# Patient Record
Sex: Female | Born: 2000 | Hispanic: No | Marital: Single | Smoking: Never smoker
Health system: Southern US, Community
[De-identification: ages and names within clinical notes are randomized; demographics above are authoritative.]

---
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
## 2023-12-08 ENCOUNTER — Encounter (HOSPITAL_COMMUNITY): Payer: Self-pay | Admitting: Family Medicine

## 2023-12-08 ENCOUNTER — Ambulatory Visit (HOSPITAL_COMMUNITY)
Admission: EM | Admit: 2023-12-08 | Discharge: 2023-12-08 | Disposition: A | Discharge status: Home / Self Care | Attending: Family Medicine | Admitting: Family Medicine

## 2023-12-08 DIAGNOSIS — J09X2 Influenza due to identified novel influenza A virus with other respiratory manifestations: Secondary | ICD-10-CM

## 2023-12-08 LAB — POC COVID19/FLU A&B COMBO
Covid Antigen, POC: NEGATIVE
Influenza A Antigen, POC: POSITIVE — AB
Influenza B Antigen, POC: NEGATIVE

## 2023-12-08 LAB — POCT RAPID STREP A (OFFICE): Rapid Strep A Screen: NEGATIVE

## 2023-12-08 MED ORDER — OSELTAMIVIR PHOSPHATE 75 MG PO CAPS: 75.0000 mg | ORAL_CAPSULE | Freq: Two times a day (BID) | ORAL | 0 refills | Status: AC

## 2023-12-08 NOTE — ED Triage Notes (Signed)
 Patient here today with c/o nasal congestion, ST, and body aches X 2 days. She has taken Dayquil with some relief. No known sick contacts.

## 2023-12-08 NOTE — ED Provider Notes (Signed)
 [facility]    CSN: [id number] Arrival date & time: 12/08/23  0803      History   Chief Complaint Chief Complaint  Patient presents with   Nasal Congestion    HPI Pamela Moran is a 23 y.o. female.   The patient reports 2 days of congestion postnasal drainage, and sore throat that started prior to a nonproductive cough.  Today she started having bodyaches.  She notably recently went to [facility] and was around a lot of people.  She has been eating and drinking well and denies any chest pain, shortness of breath, nausea, vomiting, diarrhea.  DayQuil has helped some but she has not taken any other medications.   The history is provided by the patient.    History reviewed. No pertinent past medical history.  Patient Active Problem List   Diagnosis Date Noted   Vasovagal near syncope 04/03/2015   Menstrual cramps 04/03/2015   Mild headache 04/03/2015    History reviewed. No pertinent surgical history.  OB History   No obstetric history on file.      Home Medications    Prior to Admission medications   Medication Sig Start Date End Date Taking? Authorizing Provider  oseltamivir (TAMIFLU) 75 MG capsule Take 1 capsule (75 mg total) by mouth every 12 (twelve) hours. 12/08/23  Yes Ivor Messier, MD    Family History Family History  Problem Relation Age of Onset   Migraines Mother    Pulmonary embolism Paternal Grandfather    Seizures Cousin    Heart Problems Cousin    Autism Cousin     Social History Social History   Tobacco Use   Smoking status: Never   Smokeless tobacco: Never  Vaping Use   Vaping status: Never Used  Substance Use Topics   Alcohol use: No   Drug use: No     Allergies   Other   Review of Systems Review of Systems  Constitutional:  Positive for chills. Negative for appetite change, fatigue and fever.  HENT:  Positive for congestion, postnasal drip, rhinorrhea and sore throat. Negative for ear pain, sinus  pressure, sinus pain, tinnitus, trouble swallowing and voice change.   Eyes:  Negative for redness.  Respiratory:  Negative for shortness of breath and wheezing.   Cardiovascular:  Negative for chest pain and palpitations.  Gastrointestinal:  Negative for diarrhea, nausea and vomiting.  Musculoskeletal:  Positive for myalgias. Negative for arthralgias, neck pain and neck stiffness.  Skin:  Negative for rash.  Neurological:  Negative for dizziness and light-headedness.     Physical Exam Triage Vital Signs ED Triage Vitals  Encounter Vitals Group     BP 12/08/23 0821 (!) 131/90     Systolic BP Percentile --      Diastolic BP Percentile --      Pulse Rate 12/08/23 0821 94     Resp 12/08/23 0821 16     Temp 12/08/23 0821 99.5 F (37.5 C)     Temp Source 12/08/23 0821 Oral     SpO2 12/08/23 0821 97 %     Weight 12/08/23 0820 210 lb (95.3 kg)     Height 12/08/23 0820 5\' 5"  (1.651 m)     Head Circumference --      Peak Flow --      Pain Score 12/08/23 0819 3     Pain Loc --      Pain Education --      Exclude from Growth Chart --  No data found.  Updated Vital Signs BP (!) 131/90 (BP Location: Right Arm)   Pulse 94   Temp 99.5 F (37.5 C) (Oral)   Resp 16   Ht 5\' 5"  (1.651 m)   Wt 95.3 kg   LMP 11/11/2023 (Approximate)   SpO2 97%   BMI 34.95 kg/m   Visual Acuity Right Eye Distance:   Left Eye Distance:   Bilateral Distance:    Right Eye Near:   Left Eye Near:    Bilateral Near:     Physical Exam Constitutional:      General: She is not in acute distress.    Appearance: Normal appearance. She is normal weight. She is not ill-appearing, toxic-appearing or diaphoretic.  HENT:     Head: Normocephalic and atraumatic.     Right Ear: Tympanic membrane and ear canal normal.     Left Ear: Tympanic membrane and ear canal normal.     Ears:     Comments: Mucoid effusions bilaterally    Nose: Congestion present. No rhinorrhea.     Mouth/Throat:     Mouth: Mucous  membranes are moist.     Pharynx: Posterior oropharyngeal erythema present. No oropharyngeal exudate.     Comments: 1+ tonsillar edema bilaterally All structures are midline Eyes:     General:        Right eye: No discharge.        Left eye: No discharge.     Extraocular Movements: Extraocular movements intact.     Conjunctiva/sclera: Conjunctivae normal.     Pupils: Pupils are equal, round, and reactive to light.  Cardiovascular:     Rate and Rhythm: Normal rate and regular rhythm.     Pulses: Normal pulses.     Heart sounds: No murmur heard. Pulmonary:     Effort: Pulmonary effort is normal. No respiratory distress.     Breath sounds: Normal breath sounds. No wheezing or rales.  Musculoskeletal:     Cervical back: No rigidity.  Skin:    General: Skin is warm.     Capillary Refill: Capillary refill takes 2 to 3 seconds.     Findings: No rash.  Neurological:     General: No focal deficit present.     Mental Status: She is alert.      UC Treatments / Results  Labs (all labs ordered are listed, but only abnormal results are displayed) Labs Reviewed  POC COVID19/FLU A&B COMBO - Abnormal; Notable for the following components:      Result Value   Influenza A Antigen, POC Positive (*)    All other components within normal limits  POCT RAPID STREP A (OFFICE)    EKG   Radiology No results found.  Procedures Procedures (including critical care time)  Medications Ordered in UC Medications - No data to display  Initial Impression / Assessment and Plan / UC Course  I have reviewed the triage vital signs and the nursing notes.  Pertinent labs & imaging results that were available during my care of the patient were reviewed by me and considered in my medical decision making (see chart for details).    URI due to influenza A - The patient is stable with no respiratory distress.  - We discussed symptomatic management.  -She will start Tamiflu.  I discussed the possible  side effects and average shortening of the course without decreasing symptoms. - The patient can use daily fluticasone and saline nasal spray for congestion. They can use an oral  antihistamine or guaifenesin with increased hydration as well. Hot showers for humidified air and use a bedside humidifier can also benefit if available.  - I encouraged proper intake of fruits, vegetables, and protein as well of plenty of rest.  - We discussed the use of masking in public areas to avoid spread.  - Return criteria discussed. The patient agreed with the plan and voiced understanding. All questions were answered.    Final Clinical Impressions(s) / UC Diagnoses   Final diagnoses:  Influenza due to identified novel influenza A virus with other respiratory manifestations     Discharge Instructions      You have the flu Make sure you are getting plenty of water. This allows you to heal and for medications to work.  One way to check your hydration status is the color of your urine. If it is slightly yellow and mostly clear you are hydrated. If it is dark yellow or brown you are dehydrated.  Ensure you get enough rest  You can take tylenol for fever as needed Avoid direct contact with others to avoid spread. Wear a mask and practice frequent hand washing if you must be in public.  You are inside the window for Tamiflu (Oseltamivir) treatment On average the medication can decrease the length of illness by one day but does not decrease symptoms Take the medication to completion unless side effects are intolerable. If you start feeling confusion, shortness of breath, or fever that does not respond to medication return or go to the emergency room.      ED Prescriptions     Medication Sig Dispense Auth. Provider   oseltamivir (TAMIFLU) 75 MG capsule Take 1 capsule (75 mg total) by mouth every 12 (twelve) hours. 10 capsule Ivor Messier, MD      PDMP not reviewed this encounter.   Ivor Messier, MD 12/08/23 [phone]

## 2023-12-08 NOTE — Discharge Instructions (Signed)
 You have the flu Make sure you are getting plenty of water. This allows you to heal and for medications to work.  One way to check your hydration status is the color of your urine. If it is slightly yellow and mostly clear you are hydrated. If it is dark yellow or brown you are dehydrated.  Ensure you get enough rest  You can take tylenol for fever as needed Avoid direct contact with others to avoid spread. Wear a mask and practice frequent hand washing if you must be in public.  You are inside the window for Tamiflu (Oseltamivir) treatment On average the medication can decrease the length of illness by one day but does not decrease symptoms Take the medication to completion unless side effects are intolerable. If you start feeling confusion, shortness of breath, or fever that does not respond to medication return or go to the emergency room.

## 2024-09-06 ENCOUNTER — Other Ambulatory Visit: Payer: Self-pay

## 2024-09-06 ENCOUNTER — Encounter (HOSPITAL_COMMUNITY): Payer: Self-pay

## 2024-09-06 ENCOUNTER — Emergency Department (HOSPITAL_COMMUNITY)

## 2024-09-06 ENCOUNTER — Emergency Department (HOSPITAL_COMMUNITY): Admission: EM | Admit: 2024-09-06 | Discharge: 2024-09-06 | Disposition: A | Discharge status: Home / Self Care

## 2024-09-06 DIAGNOSIS — D72829 Elevated white blood cell count, unspecified: Secondary | ICD-10-CM | POA: Diagnosis not present

## 2024-09-06 DIAGNOSIS — R739 Hyperglycemia, unspecified: Secondary | ICD-10-CM | POA: Diagnosis not present

## 2024-09-06 DIAGNOSIS — R1031 Right lower quadrant pain: Secondary | ICD-10-CM | POA: Diagnosis present

## 2024-09-06 DIAGNOSIS — N200 Calculus of kidney: Secondary | ICD-10-CM

## 2024-09-06 DIAGNOSIS — N132 Hydronephrosis with renal and ureteral calculous obstruction: Secondary | ICD-10-CM | POA: Insufficient documentation

## 2024-09-06 LAB — CBC
HCT: 40.4 % (ref 36.0–46.0)
Hemoglobin: 13.8 g/dL (ref 12.0–15.0)
MCH: 32.9 pg (ref 26.0–34.0)
MCHC: 34.2 g/dL (ref 30.0–36.0)
MCV: 96.2 fL (ref 80.0–100.0)
Platelets: 310 K/uL (ref 150–400)
RBC: 4.2 MIL/uL (ref 3.87–5.11)
RDW: 12.2 % (ref 11.5–15.5)
WBC: 11.2 K/uL — ABNORMAL HIGH (ref 4.0–10.5)
nRBC: 0 % (ref 0.0–0.2)

## 2024-09-06 LAB — URINALYSIS, ROUTINE W REFLEX MICROSCOPIC
Bilirubin Urine: NEGATIVE
Glucose, UA: NEGATIVE mg/dL
Ketones, ur: NEGATIVE mg/dL
Leukocytes,Ua: NEGATIVE
Nitrite: NEGATIVE
Protein, ur: 100 mg/dL — AB
RBC / HPF: >50 RBC/hpf (ref 0–5)
Specific Gravity, Urine: 1.029 (ref 1.005–1.030)
pH: 5 (ref 5.0–8.0)

## 2024-09-06 LAB — COMPREHENSIVE METABOLIC PANEL WITH GFR
ALT: 34 U/L (ref 0–44)
AST: 25 U/L (ref 15–41)
Albumin: 4.4 g/dL (ref 3.5–5.0)
Alkaline Phosphatase: 97 U/L (ref 38–126)
Anion gap: 12 (ref 5–15)
BUN: 12 mg/dL (ref 6–20)
CO2: 23 mmol/L (ref 22–32)
Calcium: 9.2 mg/dL (ref 8.9–10.3)
Chloride: 103 mmol/L (ref 98–111)
Creatinine, Ser: 0.72 mg/dL (ref 0.44–1.00)
GFR, Estimated: >60 mL/min
Glucose, Bld: 106 mg/dL — ABNORMAL HIGH (ref 70–99)
Potassium: 3.8 mmol/L (ref 3.5–5.1)
Sodium: 137 mmol/L (ref 135–145)
Total Bilirubin: 0.3 mg/dL (ref 0.0–1.2)
Total Protein: 7.6 g/dL (ref 6.5–8.1)

## 2024-09-06 LAB — HCG, SERUM, QUALITATIVE: Preg, Serum: NEGATIVE

## 2024-09-06 LAB — LIPASE, BLOOD: Lipase: 31 U/L (ref 11–51)

## 2024-09-06 MED ORDER — MORPHINE SULFATE (PF) 4 MG/ML IV SOLN
4.0000 mg | Freq: Once | INTRAVENOUS | Status: AC
  Administered 2024-09-06: 4 mg via INTRAVENOUS
  Filled 2024-09-06: qty 1

## 2024-09-06 MED ORDER — LACTATED RINGERS IV BOLUS
1000.0000 mL | Freq: Once | INTRAVENOUS | Status: AC
  Administered 2024-09-06: 1000 mL via INTRAVENOUS

## 2024-09-06 MED ORDER — TAMSULOSIN HCL 0.4 MG PO CAPS: 0.4000 mg | ORAL_CAPSULE | Freq: Every day | ORAL | 0 refills | Status: AC

## 2024-09-06 MED ORDER — KETOROLAC TROMETHAMINE 15 MG/ML IJ SOLN
15.0000 mg | Freq: Once | INTRAMUSCULAR | Status: AC
  Administered 2024-09-06: 15 mg via INTRAVENOUS
  Filled 2024-09-06: qty 1

## 2024-09-06 MED ORDER — ONDANSETRON HCL 4 MG/2ML IJ SOLN
4.0000 mg | Freq: Once | INTRAMUSCULAR | Status: AC
  Administered 2024-09-06: 4 mg via INTRAVENOUS
  Filled 2024-09-06: qty 2

## 2024-09-06 MED ORDER — ONDANSETRON 4 MG PO TBDP: 4.0000 mg | ORAL_TABLET | Freq: Three times a day (TID) | ORAL | 0 refills | Status: AC | PRN

## 2024-09-06 MED ORDER — PANTOPRAZOLE SODIUM 40 MG IV SOLR: 40.0000 mg | Freq: Once | INTRAVENOUS | Status: DC

## 2024-09-06 MED ORDER — IOHEXOL 350 MG/ML SOLN
75.0000 mL | Freq: Once | INTRAVENOUS | Status: AC | PRN
  Administered 2024-09-06: 75 mL via INTRAVENOUS

## 2024-09-06 MED ORDER — OXYCODONE-ACETAMINOPHEN 5-325 MG PO TABS: 1.0000 | ORAL_TABLET | Freq: Four times a day (QID) | ORAL | 0 refills | Status: AC | PRN

## 2024-09-06 NOTE — ED Provider Notes (Signed)
" °  [place] AT [facility] Provider Note   CSN: [id number] Arrival date & time: 09/06/24  9263     Patient presents with: Abdominal Pain   Pamela Moran is a 23 y.o. female.  {Add pertinent medical, surgical, social history, OB history to HPI:32947}  Abdominal Pain  Patient is a 23 year old female with no pertinent past medical history no surgical history  Patient presents emergency room      Prior to Admission medications  Medication Sig Start Date End Date Taking? Authorizing Provider  oseltamivir  (TAMIFLU ) 75 MG capsule Take 1 capsule (75 mg total) by mouth every 12 (twelve) hours. 12/08/23   Janet Lonni BRAVO, MD    Allergies: Other    Review of Systems  Gastrointestinal:  Positive for abdominal pain.    Updated Vital Signs BP 134/84 (BP Location: Right Arm)   Pulse 93   Temp 97.9 F (36.6 C)   Resp 18   Ht 5' 5 (1.651 m)   Wt 99.8 kg   SpO2 99%   BMI 36.61 kg/m   Physical Exam  (all labs ordered are listed, but only abnormal results are displayed) Labs Reviewed  CBC - Abnormal; Notable for the following components:      Result Value   WBC 11.2 (*)    All other components within normal limits  LIPASE, BLOOD  COMPREHENSIVE METABOLIC PANEL WITH GFR  URINALYSIS, ROUTINE W REFLEX MICROSCOPIC  HCG, SERUM, QUALITATIVE    EKG: None  Radiology: No results found.  {Document cardiac monitor, telemetry assessment procedure when appropriate:32947} Procedures   Medications Ordered in the ED  morphine  (PF) 4 MG/ML injection 4 mg (has no administration in time range)  ondansetron  (ZOFRAN ) injection 4 mg (has no administration in time range)  lactated ringers  bolus 1,000 mL (has no administration in time range)      {Click here for ABCD2, HEART and other calculators REFRESH Note before signing:1}                              Medical Decision Making Amount and/or Complexity of Data Reviewed Labs:  ordered. Radiology: ordered.  Risk Prescription drug management.   ***  {Document critical care time when appropriate  Document review of labs and clinical decision tools ie CHADS2VASC2, etc  Document your independent review of radiology images and any outside records  Document your discussion with family members, caretakers and with consultants  Document social determinants of health affecting pt's care  Document your decision making why or why not admission, treatments were needed:32947:::1}   Final diagnoses:  None    ED Discharge Orders     None        "

## 2024-09-06 NOTE — ED Triage Notes (Signed)
 Pt to er, pt states that around 6 am she has a sharp pain that woke her up.  States that since then she has had vomiting and diarrhea.  Pt states that she felt fine when she went to bed last night, denies similar episodes.  States that laying down makes the pain worse.

## 2024-09-06 NOTE — Discharge Instructions (Signed)
 You had a 5mm kidney stone located in your ureter. Strain all urine with a strainer that was provided to you.  Make sure you drink plenty of water to maintain hydration.  Please use Tylenol  or ibuprofen  for pain.  You may use 600 mg ibuprofen  every 6 hours or 1000 mg of Tylenol  every 6 hours.  You may choose to alternate between the 2.  This would be most effective.  Not to exceed 4 g of Tylenol  within 24 hours.  Not to exceed 3200 mg ibuprofen  24 hours.     I have prescribed or offered you other medicine for breakthrough pain. Notably there is a small amount of Tylenol --specifically 325 mg--in each dose of Percocet.  If you do take a dose of Percocet please take a 500 mg instead of the 1000 mg dose of Tylenol .     Follow up with [facility] I have given you the information for their office.  Generally kidney stones pass on their own given time in the focus of treatment is minimizing pain
